# Patient Record
Sex: Female | Born: 1970 | Race: White | Hispanic: Yes | Marital: Married | Smoking: Never smoker
Health system: Southern US, Community
[De-identification: ages and names within clinical notes are randomized; demographics above are authoritative.]

## PROBLEM LIST (undated history)

## (undated) DIAGNOSIS — G43909 Migraine, unspecified, not intractable, without status migrainosus: Secondary | ICD-10-CM

## (undated) DIAGNOSIS — F419 Anxiety disorder, unspecified: Secondary | ICD-10-CM

## (undated) DIAGNOSIS — F32A Depression, unspecified: Secondary | ICD-10-CM

## (undated) HISTORY — DX: Anxiety disorder, unspecified: F41.9

## (undated) HISTORY — DX: Depression, unspecified: F32.A

## (undated) HISTORY — DX: Migraine, unspecified, not intractable, without status migrainosus: G43.909

---

## 2004-12-31 ENCOUNTER — Ambulatory Visit: Payer: Self-pay | Admitting: *Deleted

## 2004-12-31 ENCOUNTER — Inpatient Hospital Stay (HOSPITAL_COMMUNITY): Admission: AD | Admit: 2004-12-31 | Discharge: 2005-01-27 | Payer: Self-pay | Admitting: *Deleted

## 2005-01-05 ENCOUNTER — Ambulatory Visit: Payer: Self-pay | Admitting: Neonatology

## 2005-01-30 ENCOUNTER — Inpatient Hospital Stay (HOSPITAL_COMMUNITY): Admission: AD | Admit: 2005-01-30 | Discharge: 2005-01-30 | Payer: Self-pay | Admitting: Family Medicine

## 2005-03-10 IMAGING — US US UA DOPPLER RE-EVAL
1 series · 14 of 14 positions shown · non-contrast
Comparison: none

CLINICAL DATA: Abnormal Dopplers. 
FETAL DOPPLER ULTRASOUND:
Multiple images of the gravid uterus were obtained and multiple strips from the umbilical artery and middle cerebral artery were obtained.  In certain locations interrogation of the umbilical artery revealed absent end diastolic flow with no evidence for reversal of flow.  Other portions of the cord demonstrate the presence of diastolic flow and in these sections of the cord the umbilical artery S/D ratio was elevated with a mean 5.79 (NL < 4.76) for a 27 week gestation.  The middle cerebral artery pulsatility index is low at 1.19 (NL > 1.54).  No absence or reversal of end diastolic flow was seen associated with the middle cerebral artery Doppler assessment.

[Series 1: us ua doppler re-eval · 14 of 14 slices shown]
[im 1/14]
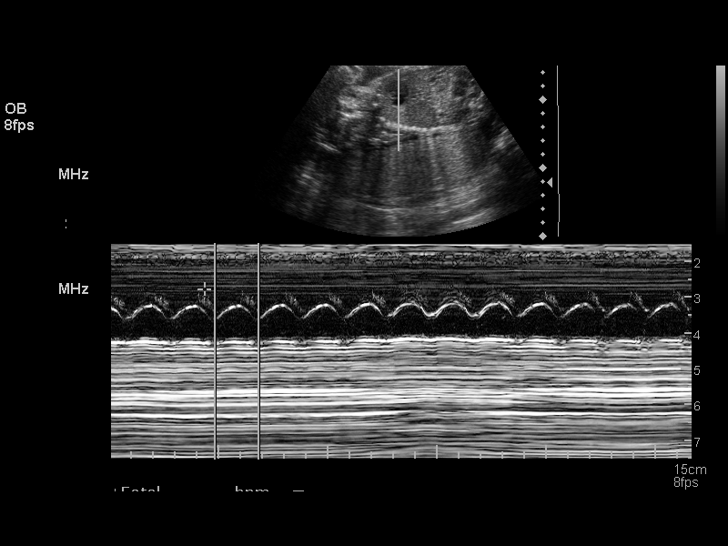
[im 2/14]
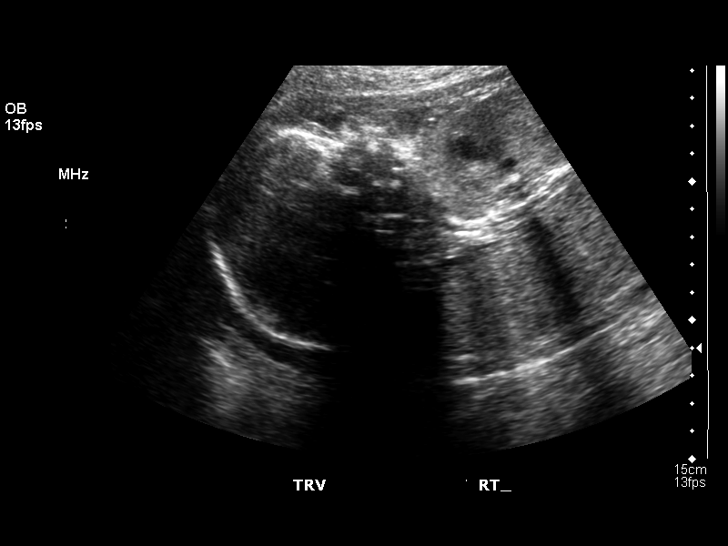
[im 3/14]
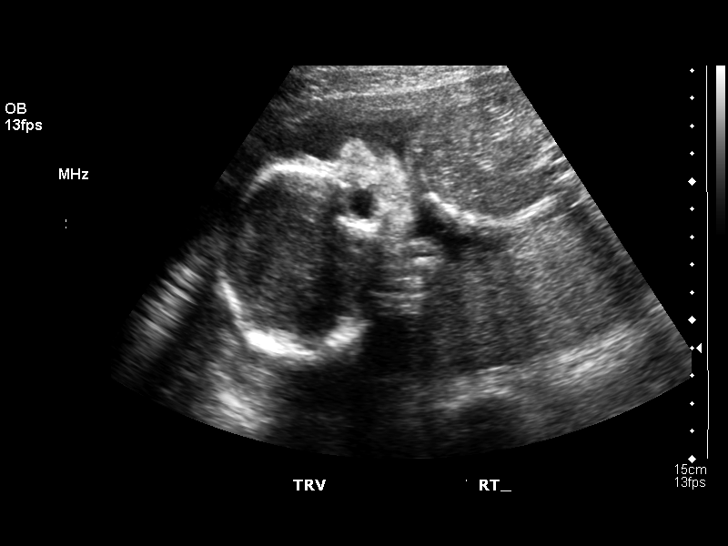
[im 4/14]
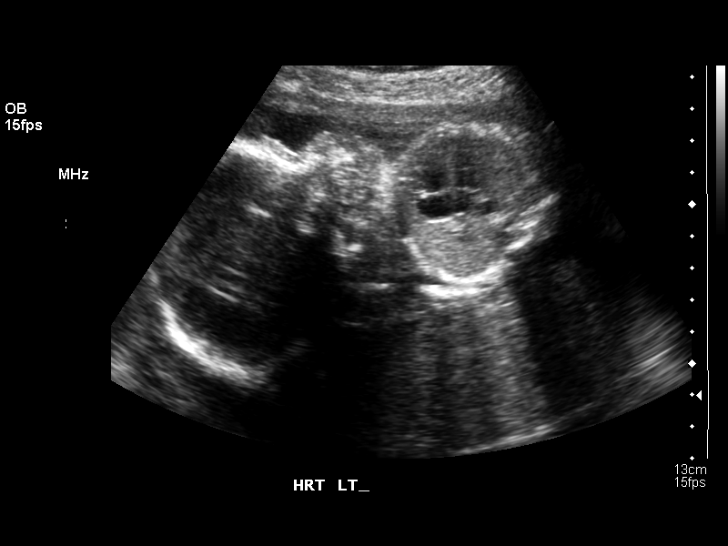
[im 5/14]
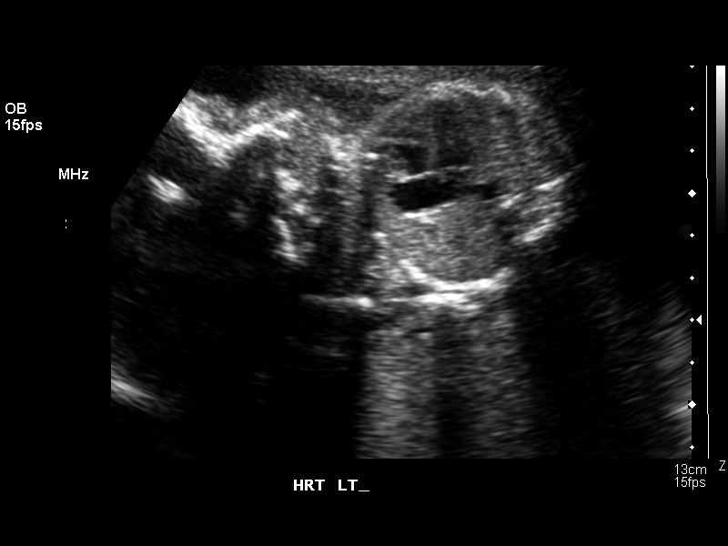
[im 6/14]
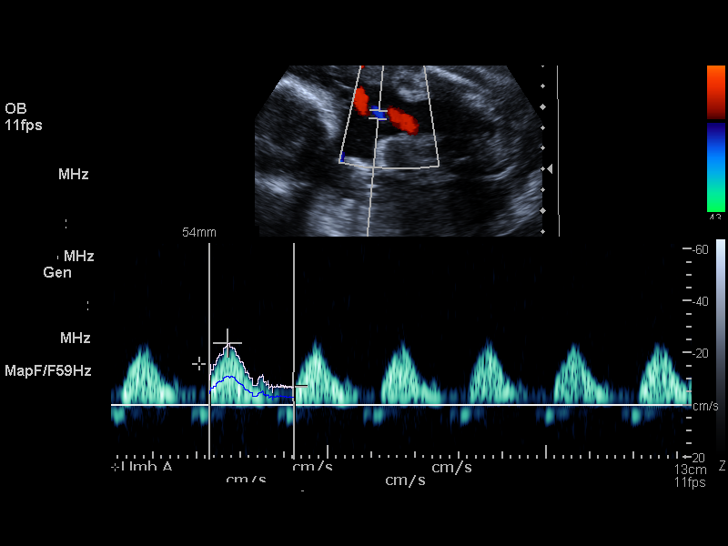
[im 7/14]
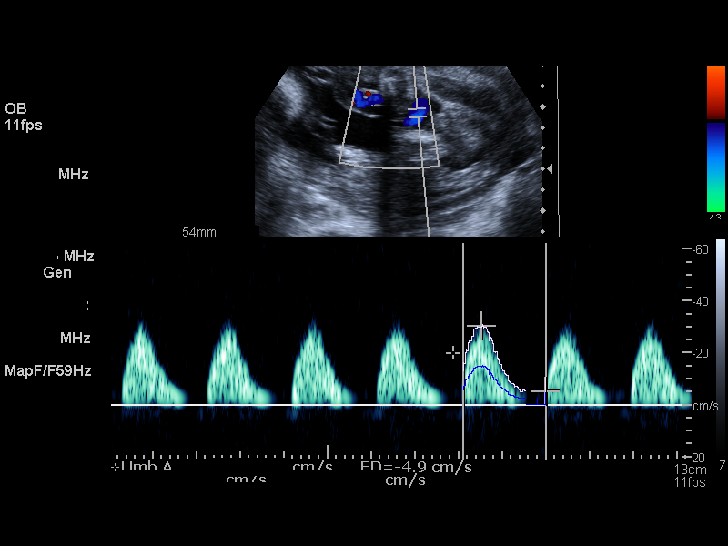
[im 8/14]
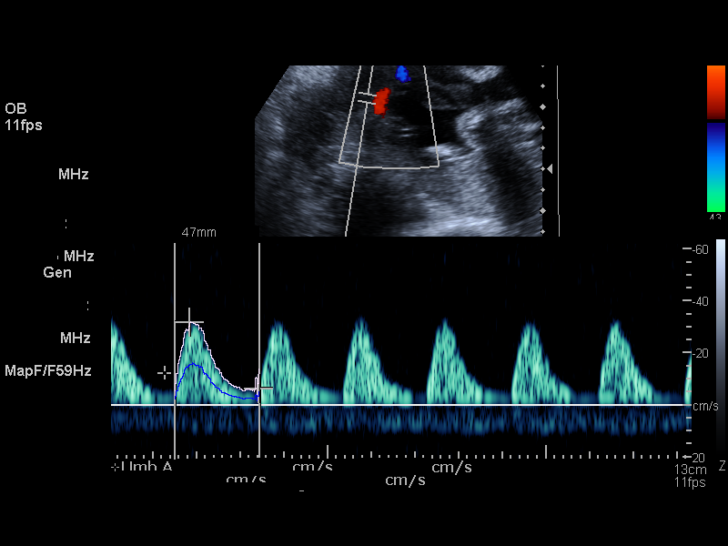
[im 9/14]
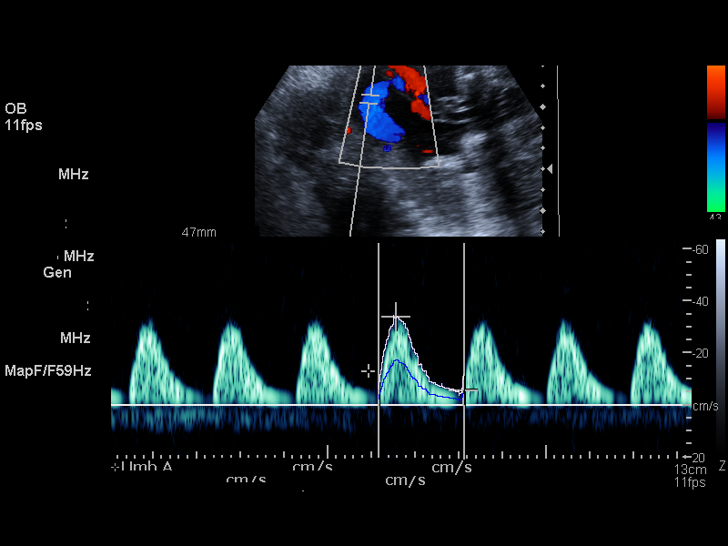
[im 10/14]
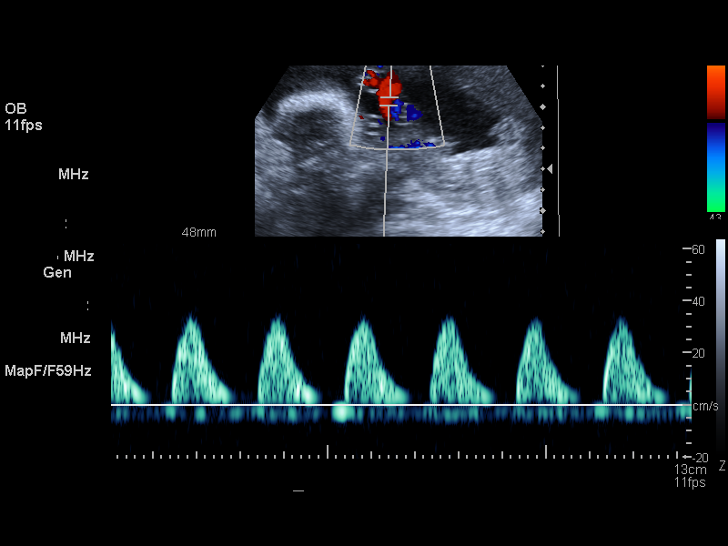
[im 11/14]
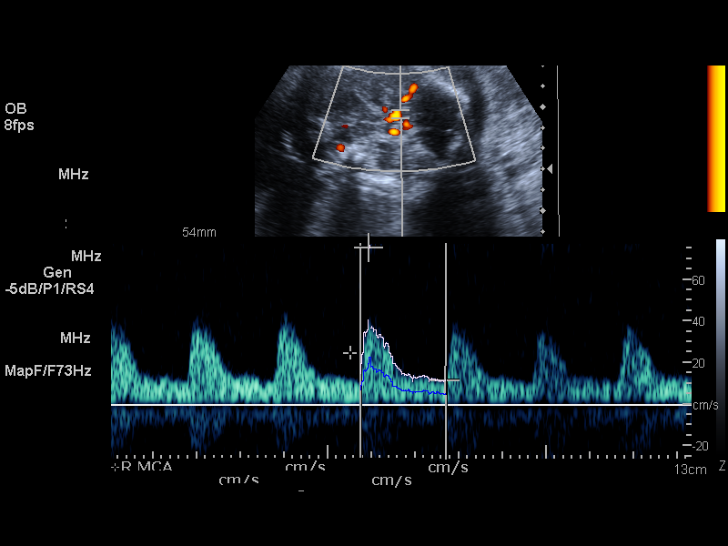
[im 12/14]
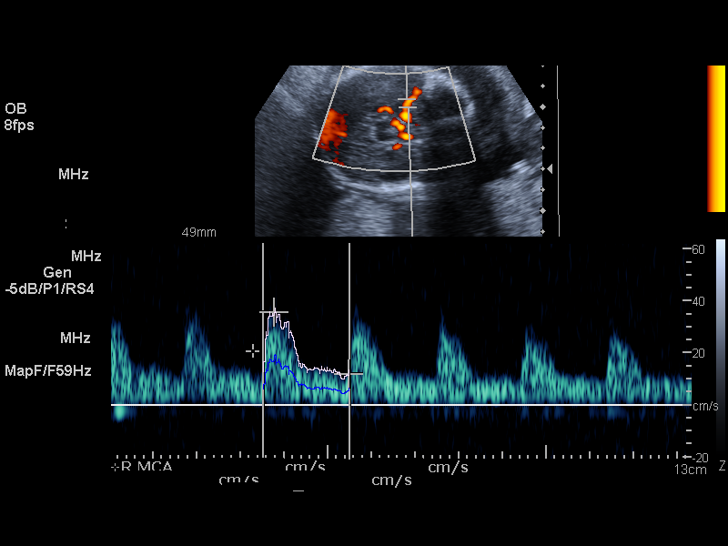
[im 13/14]
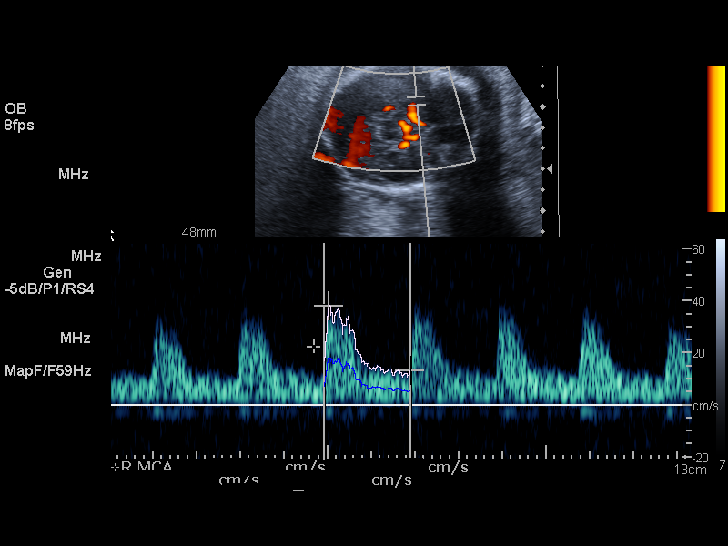
[im 14/14]
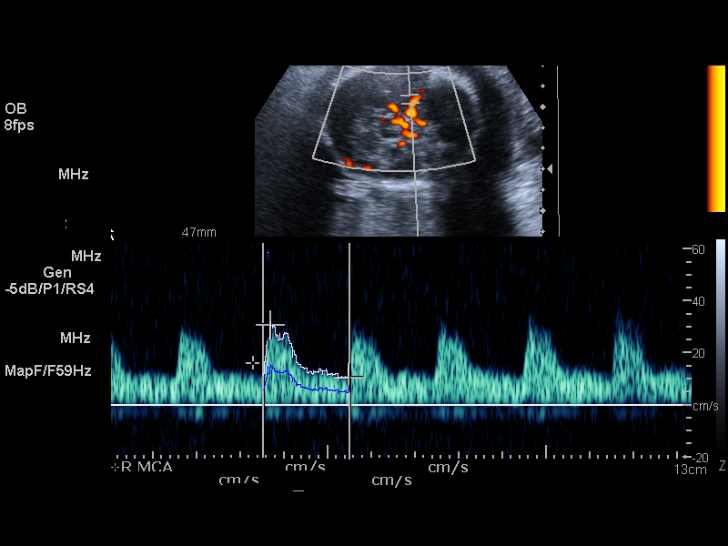

[14 of 14 positions shown; findings below may reference images not displayed]

IMPRESSION: As above.

## 2005-03-15 IMAGING — US US UA DOPPLER RE-EVAL
1 series · 14 of 18 positions shown · non-contrast
Comparison: none

CLINICAL DATA: IUGR, abnormal dopplers.

[Series 1: us ua doppler re-eval · 0.29mm/px · 14 of 18 slices shown]
[im 1/18]
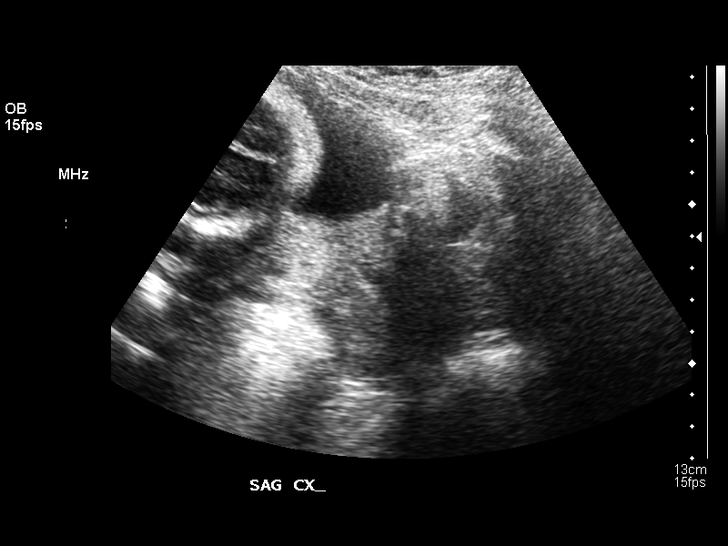
[im 2/18]
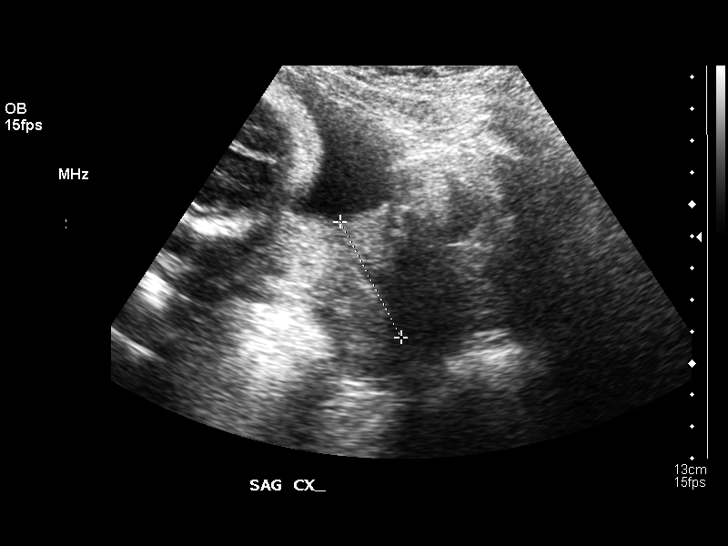
[im 4/18]
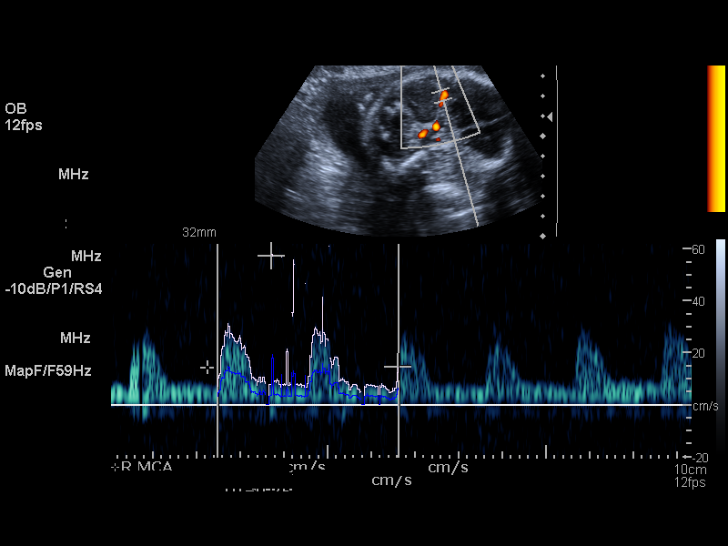
[im 5/18]
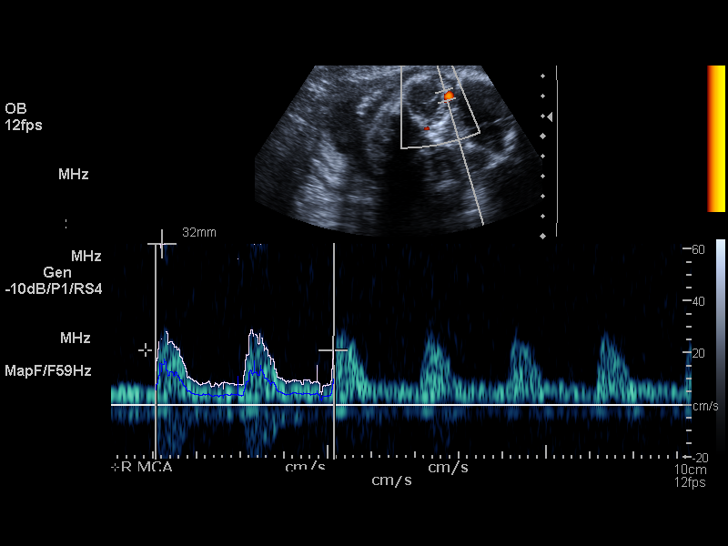
[im 6/18]
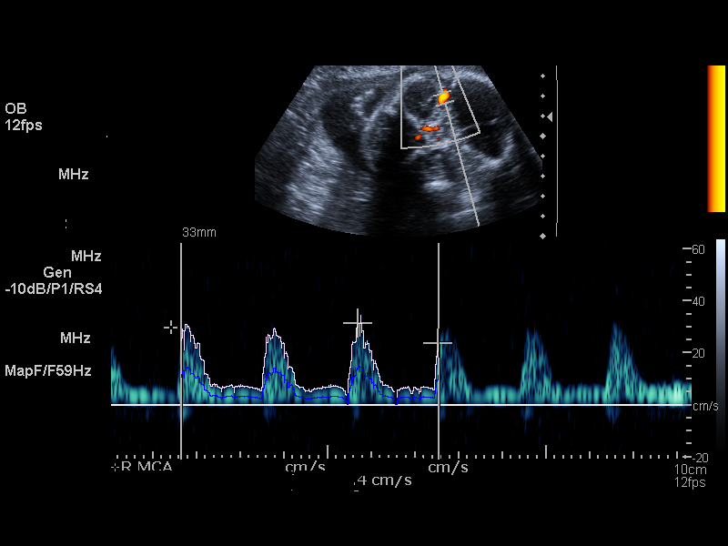
[im 8/18]
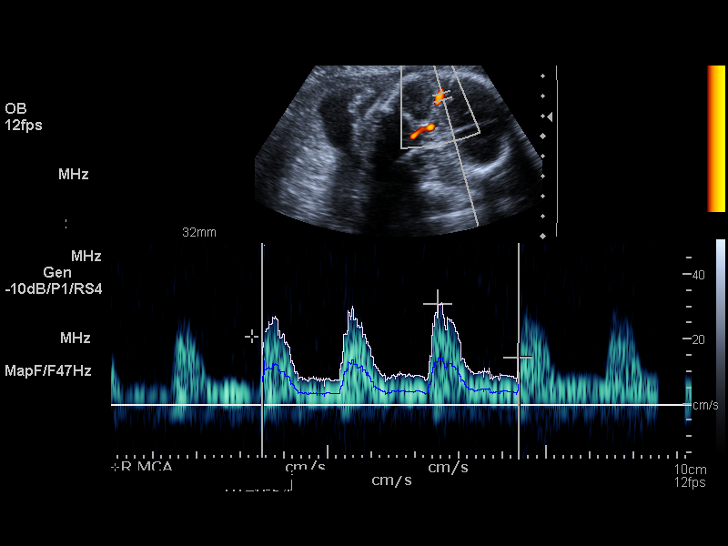
[im 9/18]
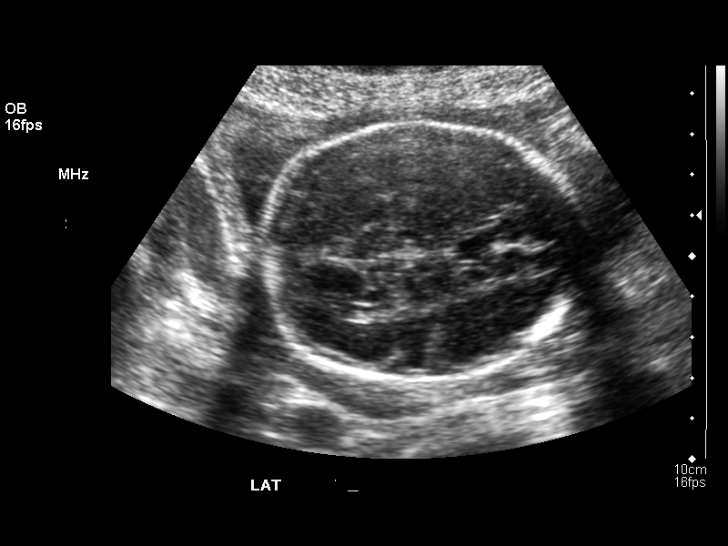
[im 10/18]
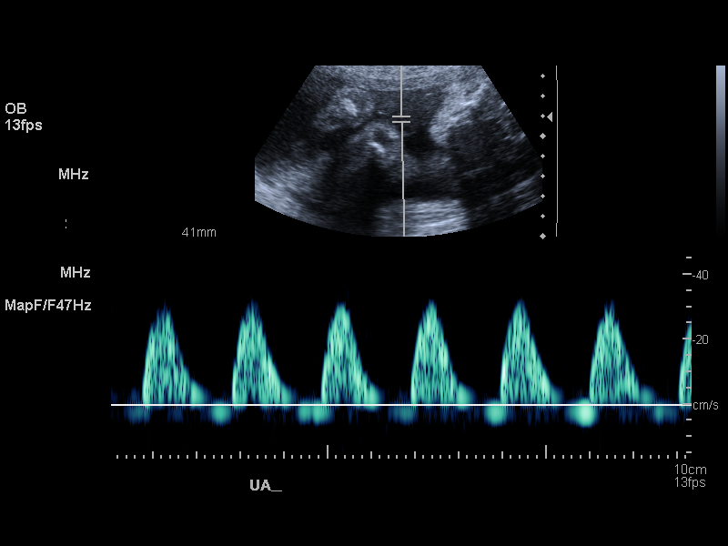
[im 11/18]
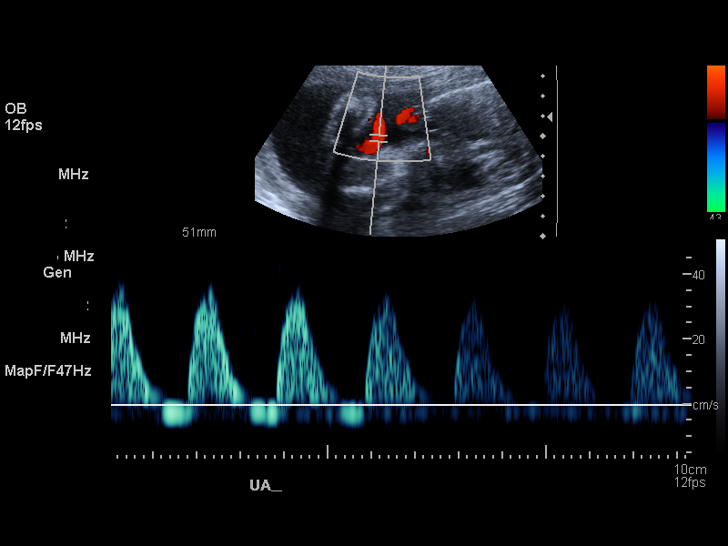
[im 13/18]
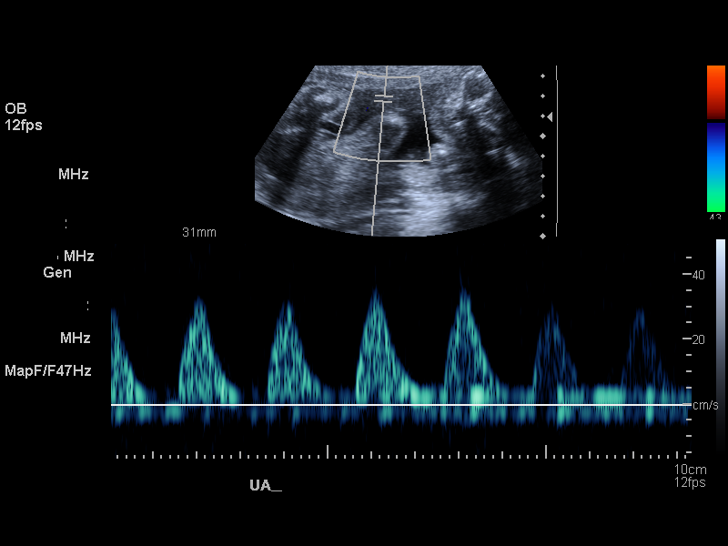
[im 14/18]
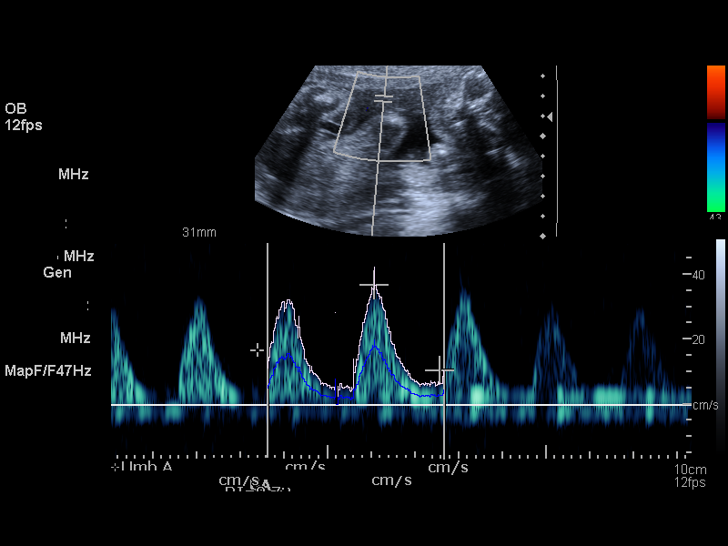
[im 15/18]
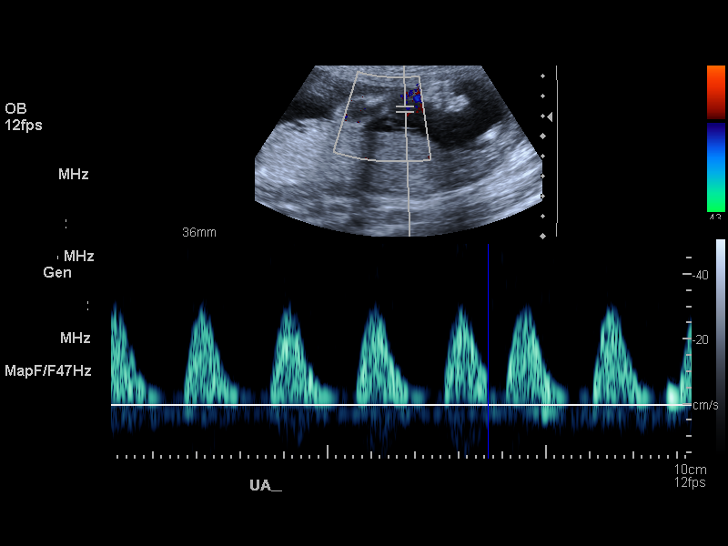
[im 17/18]
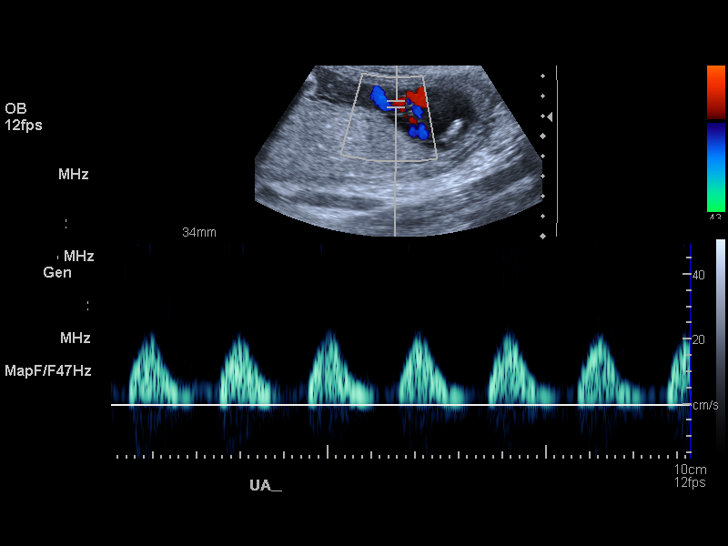
[im 18/18]
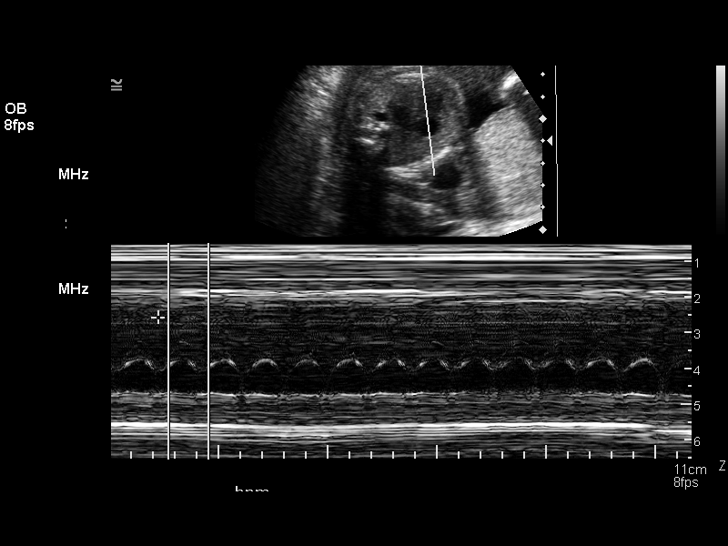

[14 of 18 positions shown; findings below may reference images not displayed]

DOPPLER ULTRASOUND OF FETUS:

 Number of Fetuses:  1
 Heart Rate: Not recorded 
 Movement:  yes
 Breathing:  no
 Presentation:  cephalic
 Amniotic Fluid (Subjective):  normal

 Fetal measurements and complete anatomic evaluation were not requested.  The following fetal anatomy was visualized during this exam:  lateral ventricles.

 DOPPLER MEASUREMENTS
 Umbilical Artery S/D Ratio:  No end diastolic flow

 Middle Cerebral Artery PI:  1.74  (NL> 1.54)

 MATERNAL FINDINGS
 Cervix:  4.1 cm transabdominally
IMPRESSION: Abnormal umbilical artery Doppler with no diastolic flow.

## 2005-03-16 IMAGING — US US UA DOPPLER RE-EVAL
1 series · 14 of 28 positions shown · non-contrast
Comparison: none

CLINICAL DATA: IUGR.  Abnormal fetal Dopplers.

[Series 1: us ua doppler re-eval · 0.29mm/px · 14 of 30 slices shown]
[im 2/30]
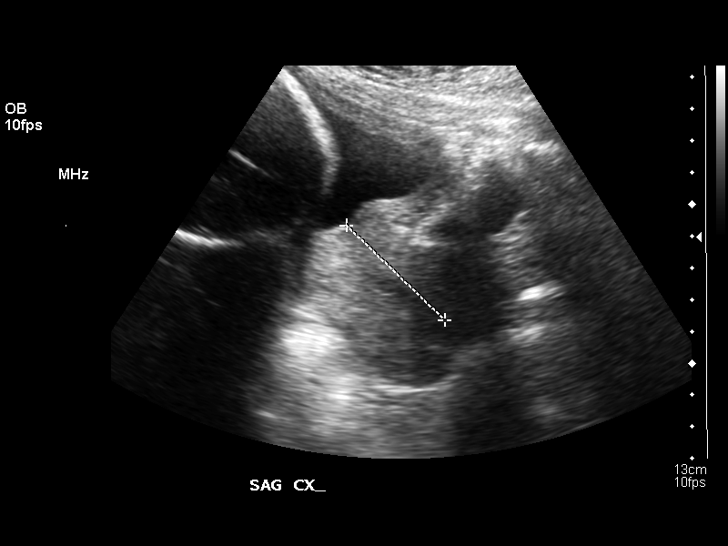
[im 4/30]
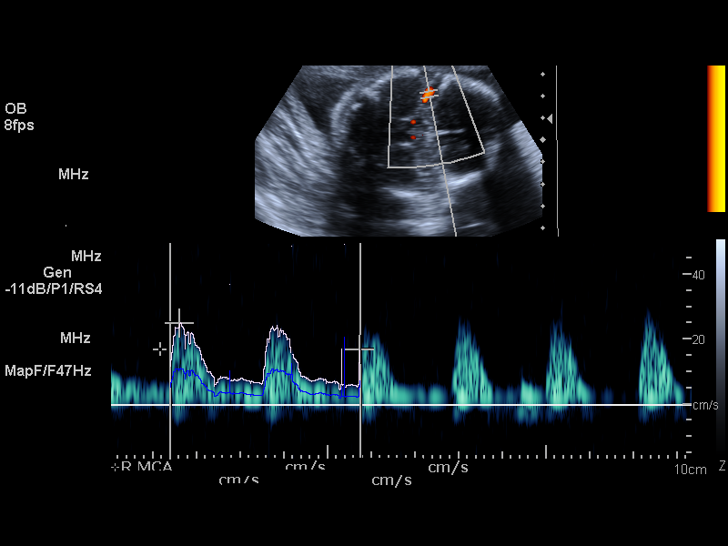
[im 6/30]
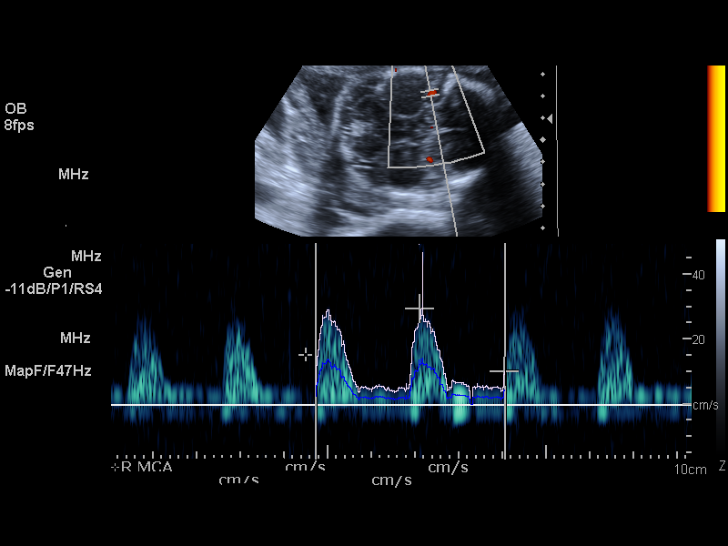
[im 8/30]
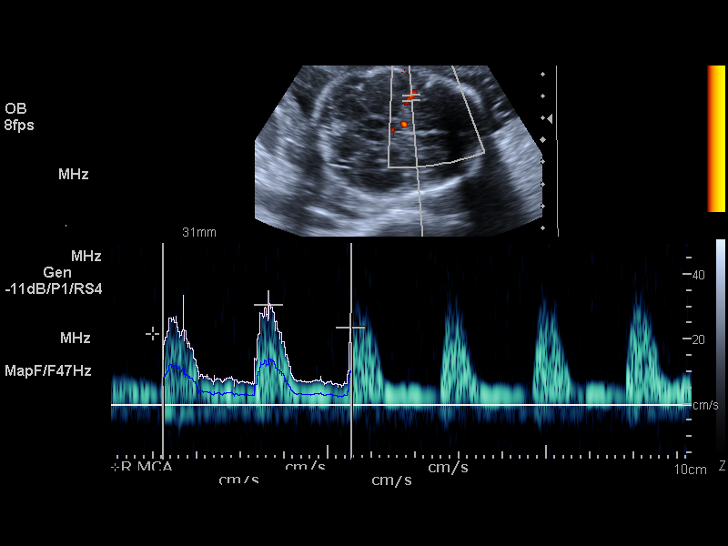
[im 10/30]
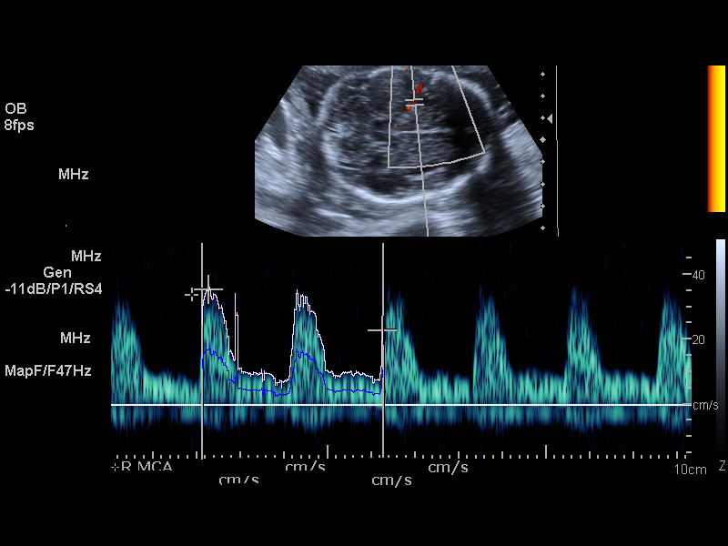
[im 12/30]
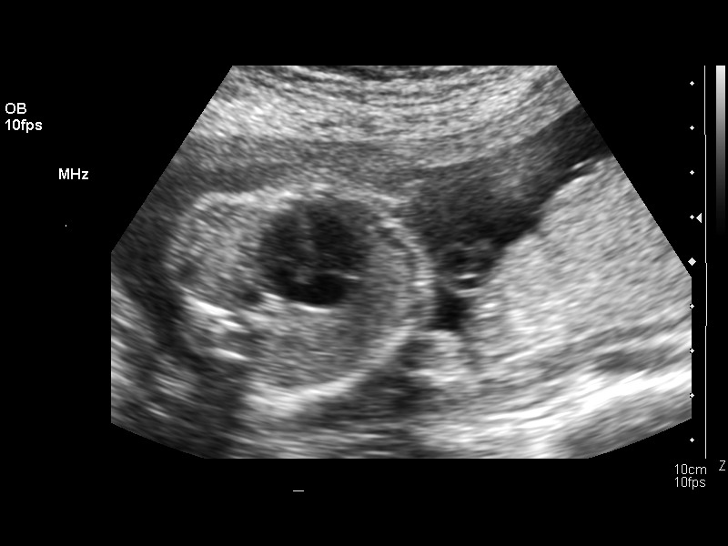
[im 14/30]
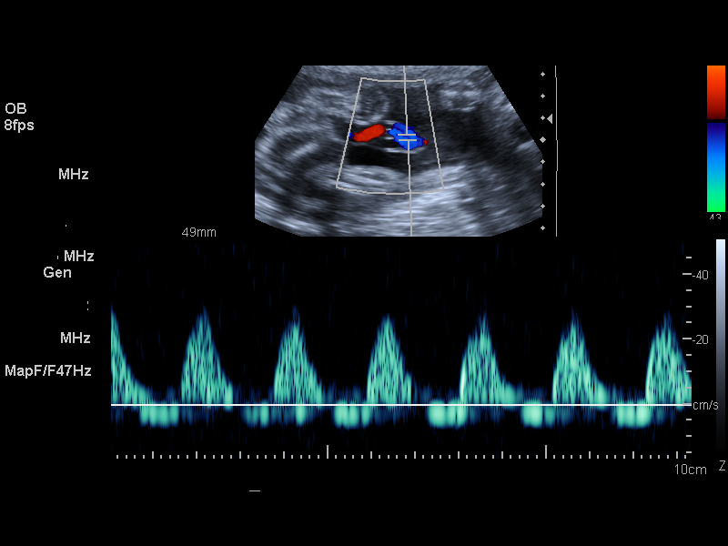
[im 17/30]
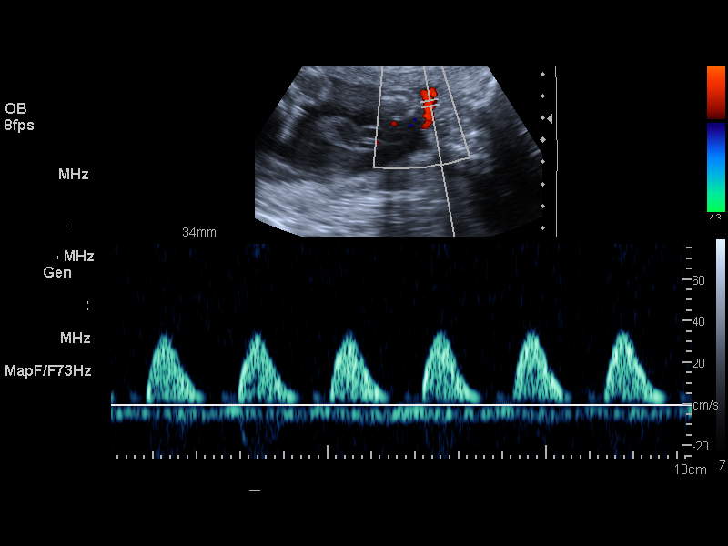
[im 19/30]
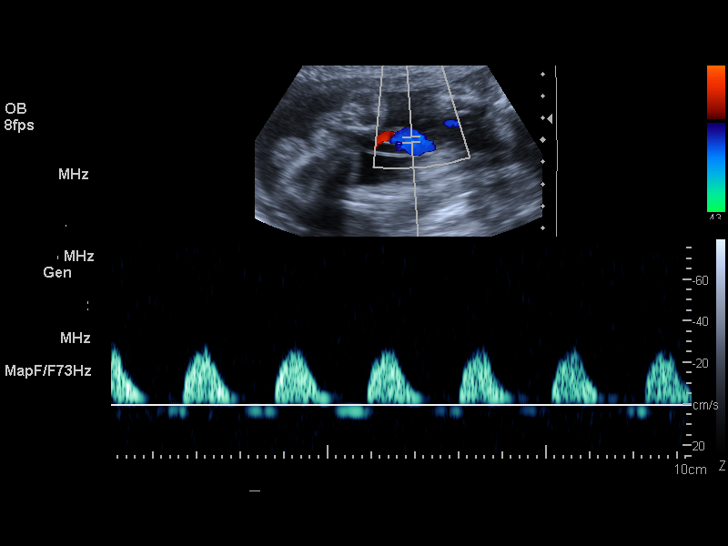
[im 21/30]
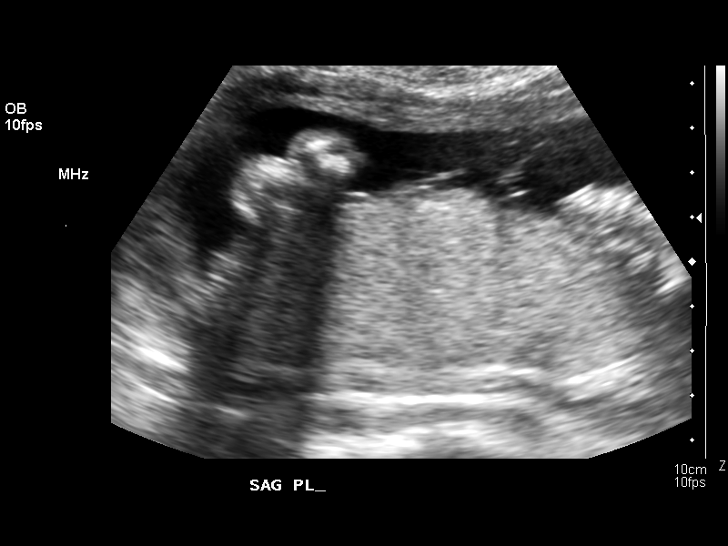
[im 23/30]
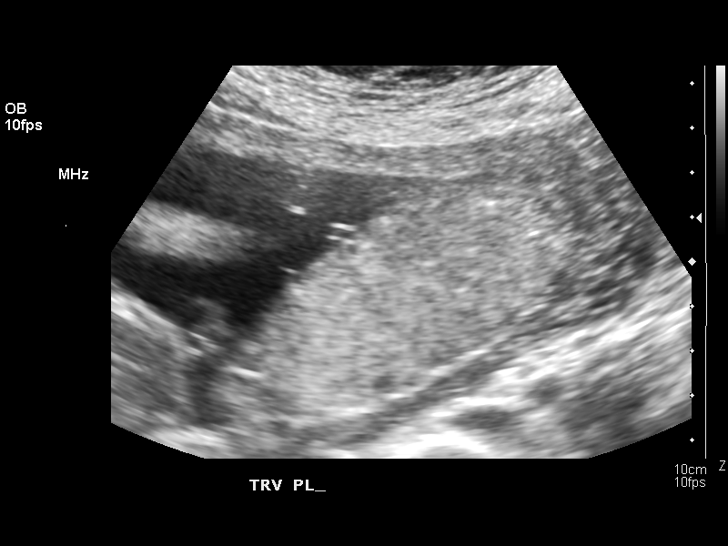
[im 25/30]
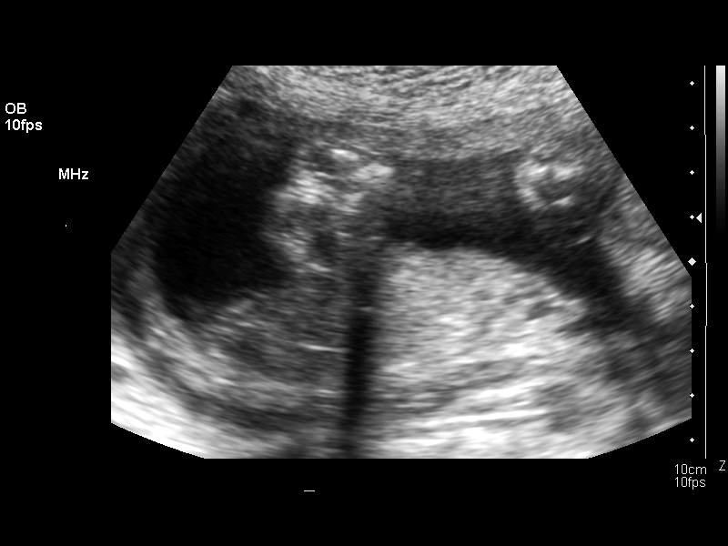
[im 27/30]
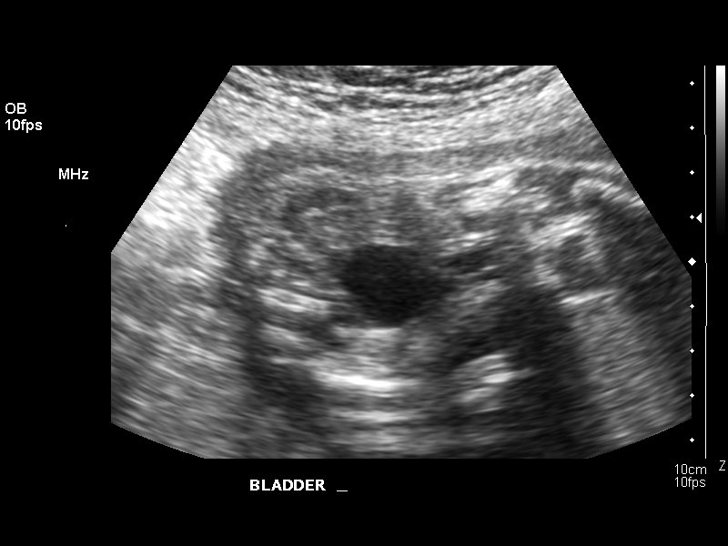
[im 30/30]
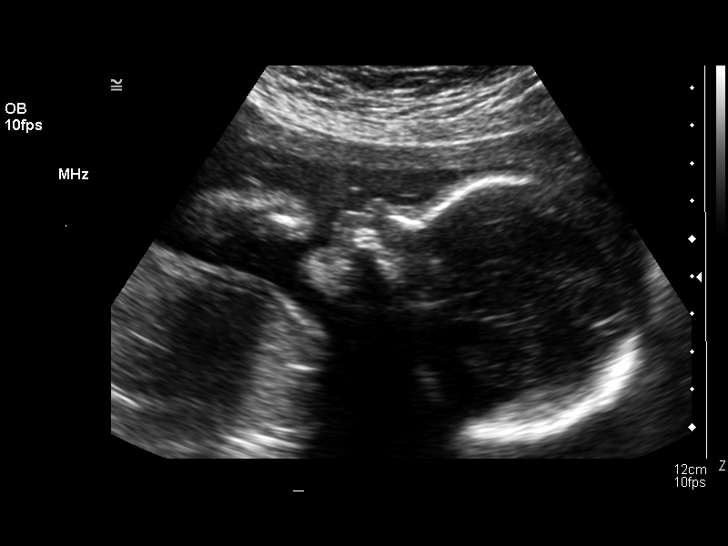

[14 of 28 positions shown; findings below may reference images not displayed]

LIMITED OBSTETRICAL ULTRASOUND:
 Number of Fetuses: 1
 Heart Rate:  139
 Movement:  Yes
 Breathing:  Yes
 Presentation:  Cephalic
 Placental Location:  Posterior
 Grade:  II
 Previa:  No
 Amniotic Fluid (Subjective):  Normal
 Amniotic Fluid (Objective):  3.4 cm Vertical pocket 

 Fetal measurements and complete anatomic evaluation were not requested.  The following fetal anatomy was visualized during this exam:  Lateral ventricles, four chamber heart, stomach, kidneys and bladder.

 MATERNAL UTERINE AND ADNEXAL FINDINGS
 Cervix:  4.3 cm Transabdominally

 DOPPLER ULTRASOUND OF FETUS:

 Umbilical Artery S/D Ratio:  No end-diastolic flow is seen.  There may be slight reversal of flow.  

 Middle Cerebral Artery PI:  1.14    (NL> 1.54)
IMPRESSION: 1.  Doppler of the umbilical artery shows no diastolic flow and possible reversal.
 2.  Abnormal MCA pulsatility index.
 3.  Quantity of amniotic fluid at 5th percentile for 27 week gestation.

## 2005-03-18 IMAGING — US US UA DOPPLER RE-EVAL
1 series · 13 of 13 positions shown · non-contrast
Comparison: none

CLINICAL DATA: 28 weeks gestational age.  IUGR.  Follow-up fetal Dopplers.

[Series 1: us ua doppler re-eval · 0.29mm/px · 13 of 13 slices shown]
[im 1/13]
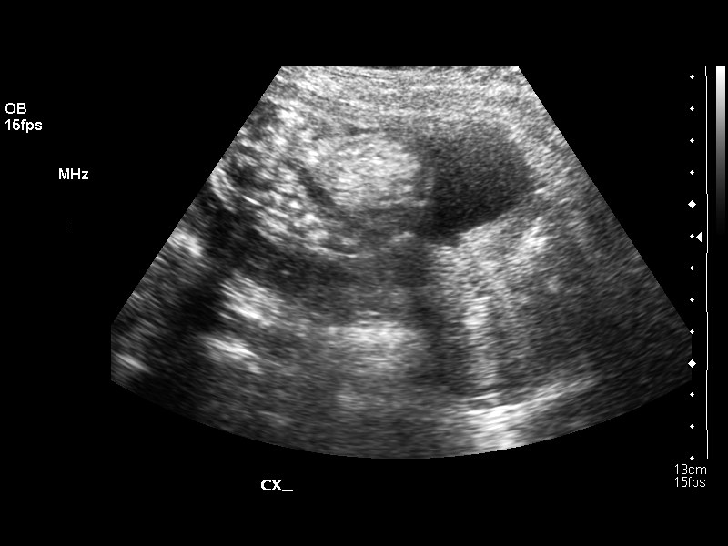
[im 2/13]
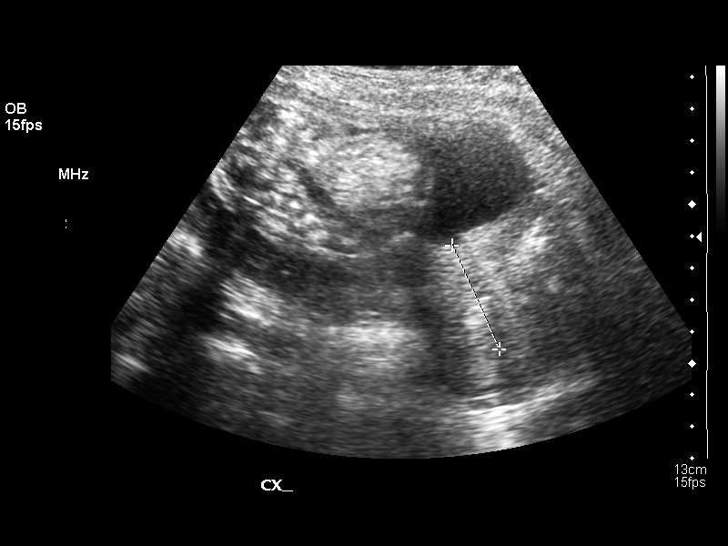
[im 3/13]
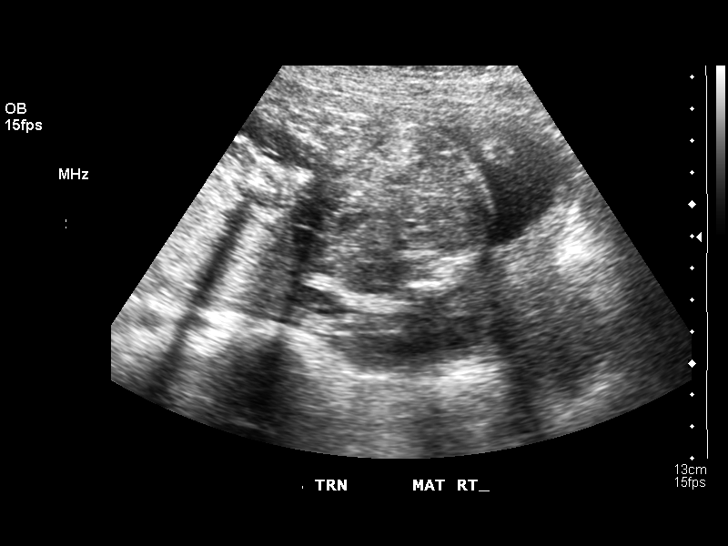
[im 4/13]
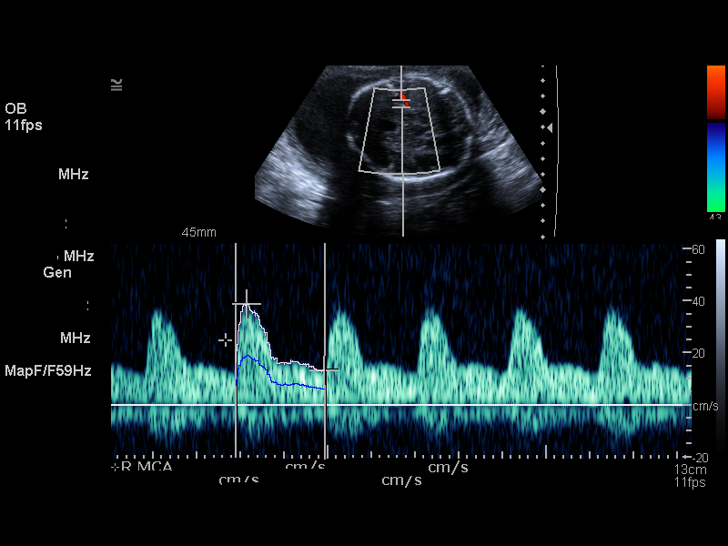
[im 5/13]
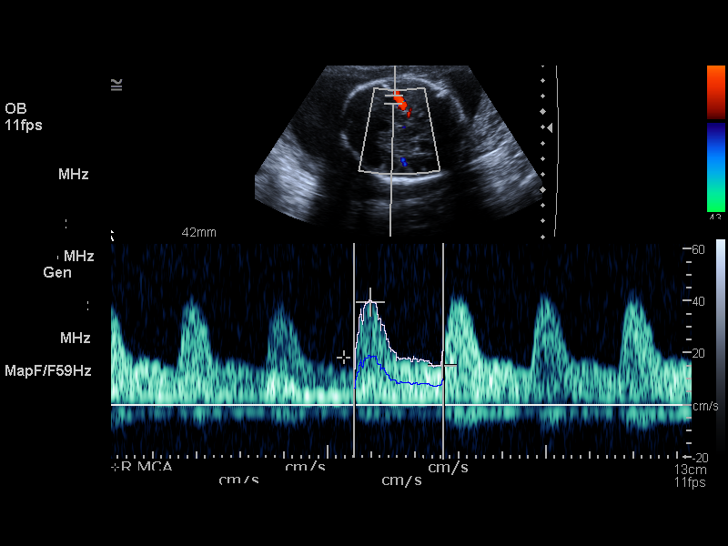
[im 6/13]
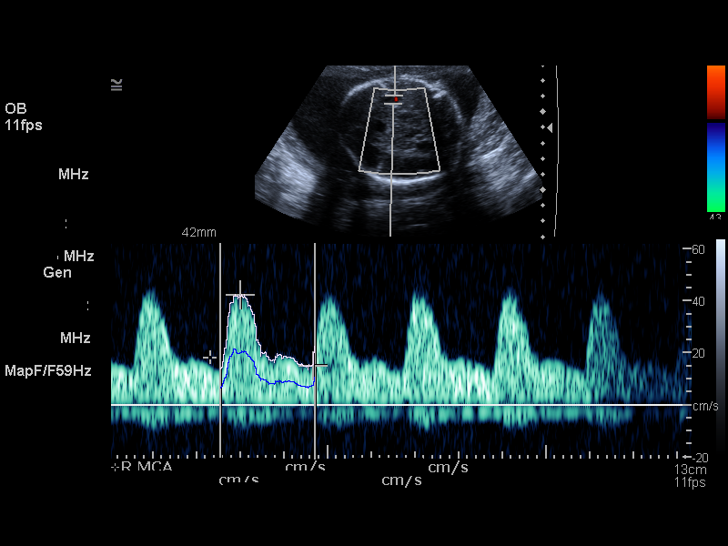
[im 7/13]
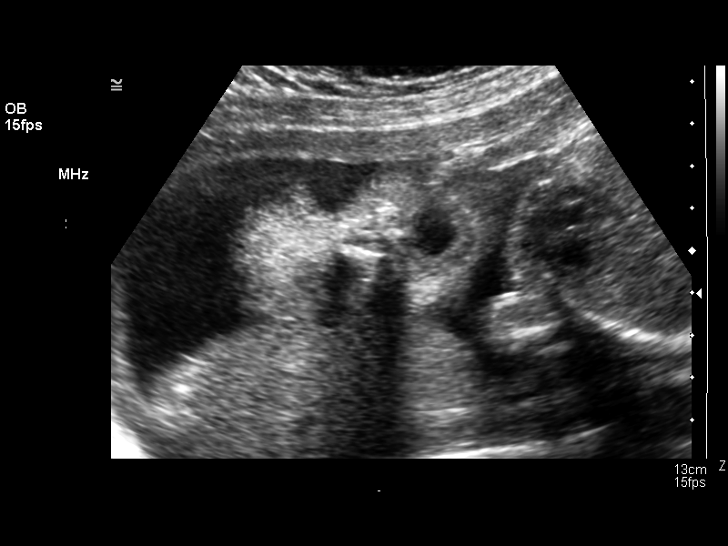
[im 8/13]
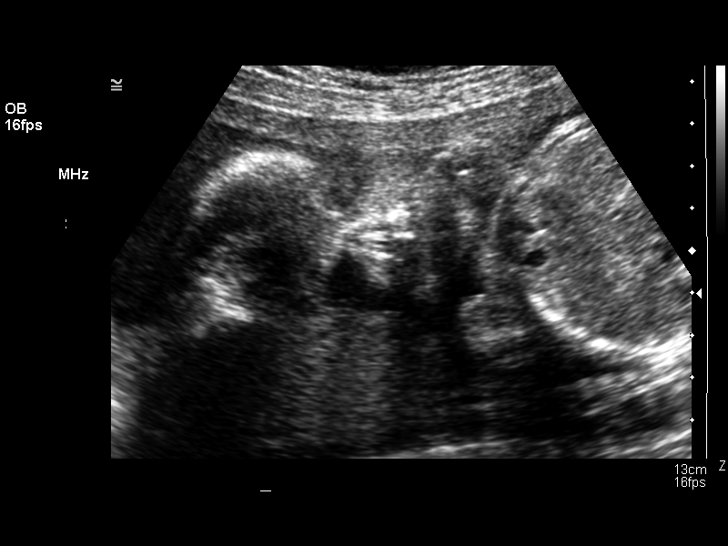
[im 9/13]
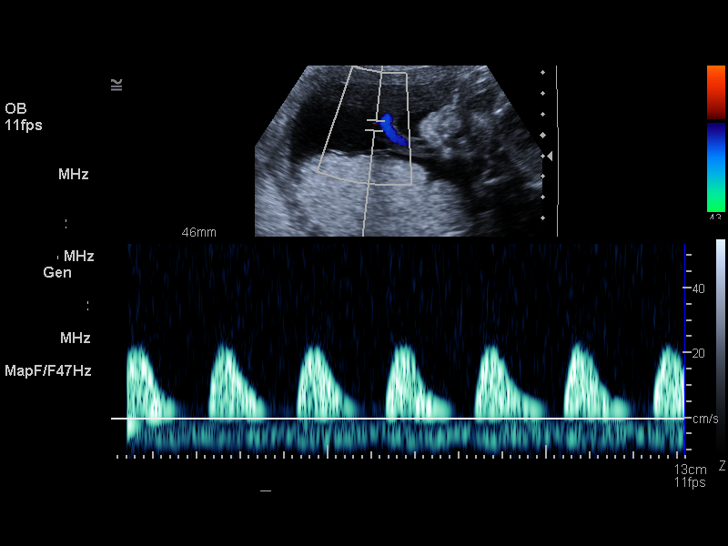
[im 10/13]
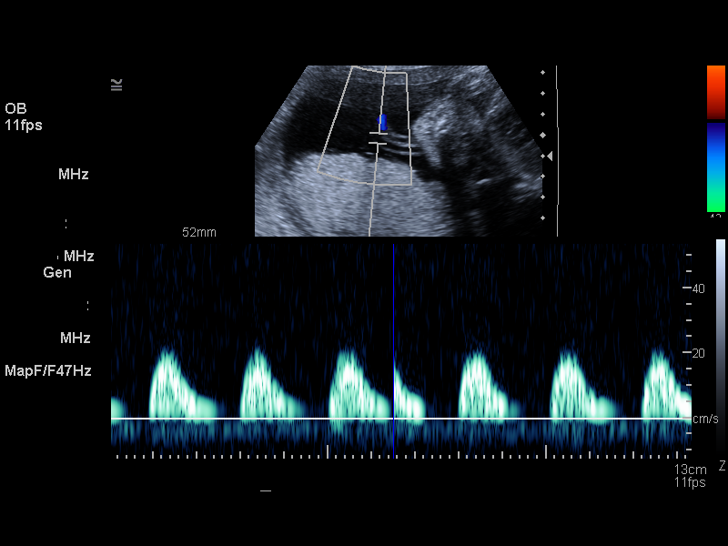
[im 11/13]
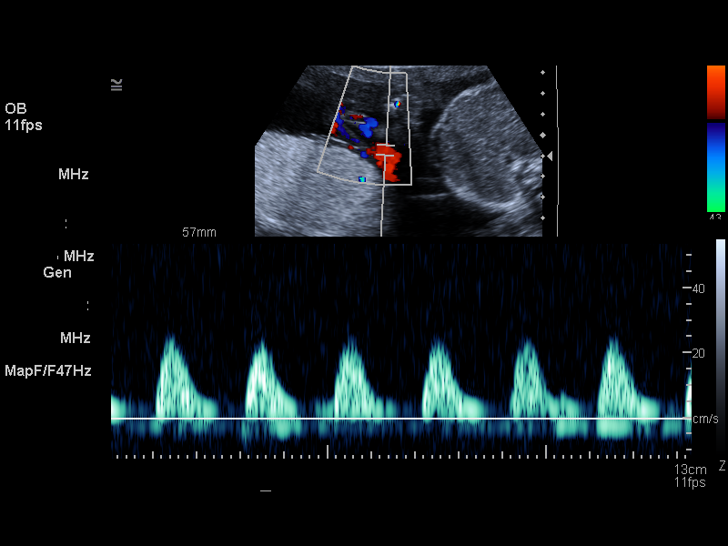
[im 12/13]
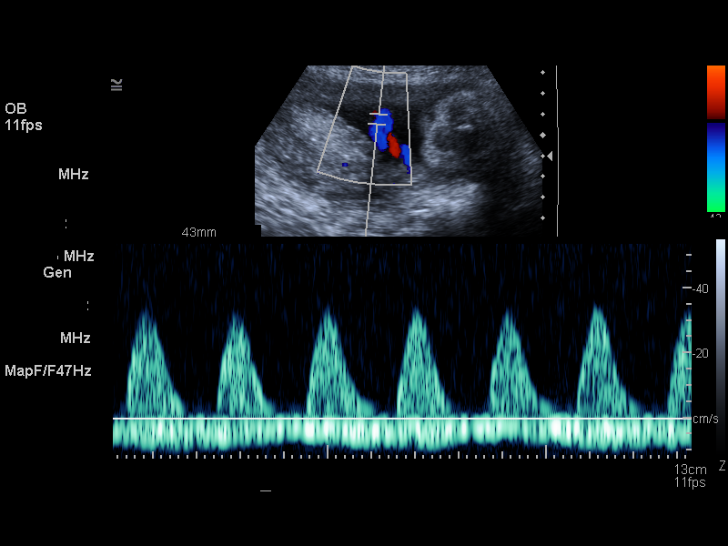
[im 13/13]
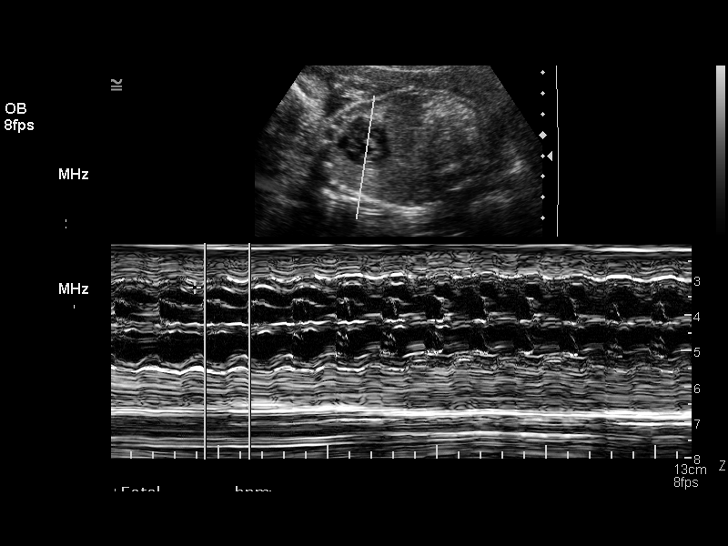

[13 of 13 positions shown; findings below may reference images not displayed]

DOPPLER ULTRASOUND OF FETUS:

 Number of Fetuses:  1
 Heart Rate:  147
 Movement:  Yes

 DOPPLER MEASUREMENTS
 Umbilical Artery S/D Ratio: Spectral waveform shows absence of diastolic flow, however there is no evidence of diastolic flow reversal

 Middle Cerebral Artery PI: 1.11    (NL> 1.54)

 MATERNAL FINDINGS
 Cervix: 3.6 cm Transabdominally
IMPRESSION: 1.  Persistent abnormal umbilical artery Doppler with absent diastolic flow.  There is no evidence of diastolic flow reversal.
 2.  Persistent decreased middle cerebral artery pulsatility index.

## 2019-09-16 ENCOUNTER — Other Ambulatory Visit: Payer: Self-pay

## 2019-09-16 DIAGNOSIS — Z20822 Contact with and (suspected) exposure to covid-19: Secondary | ICD-10-CM

## 2019-09-17 LAB — NOVEL CORONAVIRUS, NAA: SARS-CoV-2, NAA: NOT DETECTED

## 2019-09-19 ENCOUNTER — Telehealth: Payer: Self-pay | Admitting: General Practice

## 2019-09-19 NOTE — Telephone Encounter (Signed)
Gave patient negative covid test results Patient understood 

## 2022-04-28 ENCOUNTER — Other Ambulatory Visit (HOSPITAL_COMMUNITY)
Admission: RE | Admit: 2022-04-28 | Discharge: 2022-04-28 | Disposition: A | Discharge status: Home / Self Care | Payer: Self-pay | Source: Ambulatory Visit | Attending: Obstetrics & Gynecology | Admitting: Obstetrics & Gynecology

## 2022-04-28 ENCOUNTER — Encounter: Payer: Self-pay | Admitting: Obstetrics & Gynecology

## 2022-04-28 ENCOUNTER — Ambulatory Visit (INDEPENDENT_AMBULATORY_CARE_PROVIDER_SITE_OTHER): Payer: Self-pay | Admitting: Obstetrics & Gynecology

## 2022-04-28 VITALS — BP 110/74 | HR 70 | Resp 16 | Ht 61.75 in | Wt 134.0 lb

## 2022-04-28 DIAGNOSIS — Z01419 Encounter for gynecological examination (general) (routine) without abnormal findings: Secondary | ICD-10-CM | POA: Insufficient documentation

## 2022-04-28 DIAGNOSIS — Z780 Asymptomatic menopausal state: Secondary | ICD-10-CM

## 2022-04-28 NOTE — Progress Notes (Signed)
? ? ?Tamara Hanna [facility] [birth year] [id number] ? ? ?History:    51 y.o. G2P2L2  Married ? ?RP:  New patient presenting for annual gyn exam  ? ?HPI:  Postmenopausal, well on no HRT.  No PMB.  No pelvic pain.  Dryness with IC.  Recommend coconut oil.  Last Pap 13 yrs ago, negative.  Pap reflex today.  Breasts normal.  Will schedule first Mammo.  BMI 24.71.  Fasting health labs here today.  Schedule Colono.  Has Fibromyalgia. ? ? ?Past medical history,surgical history, family history and social history were all reviewed and documented in the EPIC chart. ? ?Gynecologic History ?No LMP recorded. Patient is postmenopausal. ? ?Obstetric History ?OB History  ?Gravida Para Term Preterm AB Living  ?$Remove'2 2 1 1   2  '[id number]$ ?SAB IAB Ectopic Multiple Live Births  ?        2  ?  ?# Outcome Date GA Lbr Len/2nd Weight Sex Delivery Anes PTL Lv  ?2 Preterm           ?1 Term           ? ? ? ?ROS: A ROS was performed and pertinent positives and negatives are included in the history. ? GENERAL: No fevers or chills. HEENT: No change in vision, no earache, sore throat or sinus congestion. NECK: No pain or stiffness. CARDIOVASCULAR: No chest pain or pressure. No palpitations. PULMONARY: No shortness of breath, cough or wheeze. GASTROINTESTINAL: No abdominal pain, nausea, vomiting or diarrhea, melena or bright red blood per rectum. GENITOURINARY: No urinary frequency, urgency, hesitancy or dysuria. MUSCULOSKELETAL: No joint or muscle pain, no back pain, no recent trauma. DERMATOLOGIC: No rash, no itching, no lesions. ENDOCRINE: No polyuria, polydipsia, no heat or cold intolerance. No recent change in weight. HEMATOLOGICAL: No anemia or easy bruising or bleeding. NEUROLOGIC: No headache, seizures, numbness, tingling or weakness. PSYCHIATRIC: No depression, no loss of interest in normal activity or change in sleep pattern.  ?  ? ?Exam: ? ? ?BP 110/74   Pulse 70   Resp 16   Ht 5' 1.75" (1.568 m)   Wt 134 lb (60.8 kg)   BMI 24.71 kg/m?  ? ?Body  mass index is 24.71 kg/m?. ? ?General appearance : Well developed well nourished female. No acute distress ?HEENT: Eyes: no retinal hemorrhage or exudates,  Neck supple, trachea midline, no carotid bruits, no thyroidmegaly ?Lungs: Clear to auscultation, no rhonchi or wheezes, or rib retractions  ?Heart: Regular rate and rhythm, no murmurs or gallops ?Breast:Examined in sitting and supine position were symmetrical in appearance, no palpable masses or tenderness,  no skin retraction, no nipple inversion, no nipple discharge, no skin discoloration, no axillary or supraclavicular lymphadenopathy ?Abdomen: no palpable masses or tenderness, no rebound or guarding ?Extremities: no edema or skin discoloration or tenderness ? ?Pelvic: Vulva: Normal ?            Vagina: No gross lesions or discharge ? Cervix: No gross lesions or discharge.  Pap reflex done. ? Uterus  AV, normal size, shape and consistency, non-tender and mobile ? Adnexa  Without masses or tenderness ? Anus: Normal ? ? ?Assessment/Plan:  51 y.o. female for annual exam  ? ?1. Encounter for routine gynecological examination with Papanicolaou smear of cervix ?Postmenopausal, well on no HRT.  No PMB.  No pelvic pain.  Dryness with IC.  Recommend coconut oil.  Last Pap 13 yrs ago, negative.  Pap reflex today.  Breasts normal.  Will schedule first Mammo.  BMI 24.71.  Fasting health labs here today.  Schedule Colono.  Has Fibromyalgia. ?- Cytology - PAP( [place]) ?- CBC ?- Comp Met (CMET) ?- TSH ?- Lipid Profile ?- Vitamin D (25 hydroxy) ? ?2. Postmenopause ?Postmenopausal, well on no HRT.  No PMB.  No pelvic pain.  Dryness with IC.  Recommend coconut oil. ? ?Other orders ?- Apremilast (OTEZLA) 30 MG TABS; Take by mouth. ?- Cholecalciferol (VITAMIN D3 PO); Take by mouth. ?- B Complex Vitamins (B COMPLEX PO); Take by mouth. ?- MAGNESIUM PO; Take by mouth. ?- Turmeric (QC TUMERIC COMPLEX PO); Take by mouth.  ? ?Princess Bruins MD, 11:06 AM 04/28/2022 ? ?  ?

## 2022-04-30 ENCOUNTER — Other Ambulatory Visit: Payer: Self-pay

## 2022-04-30 DIAGNOSIS — R7989 Other specified abnormal findings of blood chemistry: Secondary | ICD-10-CM

## 2022-05-04 LAB — CYTOLOGY - PAP
Comment: NEGATIVE
Diagnosis: UNDETERMINED — AB
High risk HPV: NEGATIVE

## 2022-05-06 LAB — LIPID PANEL
Cholesterol: 229 mg/dL — ABNORMAL HIGH (ref <200)
HDL: 64 mg/dL (ref >=50)
LDL Cholesterol (Calc): 141 mg/dL (calc) — ABNORMAL HIGH
Non-HDL Cholesterol (Calc): 165 mg/dL (calc) — ABNORMAL HIGH (ref <130)
Total CHOL/HDL Ratio: 3.6 (calc) (ref <5.0)
Triglycerides: 118 mg/dL (ref <150)

## 2022-05-06 LAB — CBC
HCT: 39.5 % (ref 35.0–45.0)
Hemoglobin: 13.3 g/dL (ref 11.7–15.5)
MCH: 30.1 pg (ref 27.0–33.0)
MCHC: 33.7 g/dL (ref 32.0–36.0)
MCV: 89.4 fL (ref 80.0–100.0)
MPV: 9.8 fL (ref 7.5–12.5)
Platelets: 331 10*3/uL (ref 140–400)
RBC: 4.42 10*6/uL (ref 3.80–5.10)
RDW: 12.2 % (ref 11.0–15.0)
WBC: 6.1 10*3/uL (ref 3.8–10.8)

## 2022-05-06 LAB — COMPREHENSIVE METABOLIC PANEL
AG Ratio: 1.5 (calc) (ref 1.0–2.5)
ALT: 43 U/L — ABNORMAL HIGH (ref 6–29)
AST: 27 U/L (ref 10–35)
Albumin: 4.8 g/dL (ref 3.6–5.1)
Alkaline phosphatase (APISO): 98 U/L (ref 31–125)
BUN: 11 mg/dL (ref 7–25)
CO2: 27 mmol/L (ref 20–32)
Calcium: 10.4 mg/dL — ABNORMAL HIGH (ref 8.6–10.2)
Chloride: 103 mmol/L (ref 98–110)
Creat: 0.82 mg/dL (ref 0.50–0.99)
Globulin: 3.1 g/dL (calc) (ref 1.9–3.7)
Glucose, Bld: 86 mg/dL (ref 65–99)
Potassium: 4.8 mmol/L (ref 3.5–5.3)
Sodium: 141 mmol/L (ref 135–146)
Total Bilirubin: 0.5 mg/dL (ref 0.2–1.2)
Total Protein: 7.9 g/dL (ref 6.1–8.1)

## 2022-05-06 LAB — TSH: TSH: 2.07 mIU/L

## 2022-05-06 LAB — VITAMIN D 25 HYDROXY (VIT D DEFICIENCY, FRACTURES): Vit D, 25-Hydroxy: 37 ng/mL (ref 30–100)

## 2022-06-05 ENCOUNTER — Telehealth: Payer: Self-pay | Admitting: *Deleted

## 2022-06-05 NOTE — Telephone Encounter (Signed)
Dr. Seymour Bars -please review PAP dated 04/28/22 and advise on follow-up.

## 2022-06-17 NOTE — Telephone Encounter (Signed)
Genia Del, MD  You 20 hours ago (1:15 PM)    ASCUS/HPV HR Negative.  Previous Pap Neg about 13 yrs ago.  Repeat Pap test in 1 year at next [facility]am.    Routing to [facility] Spanish pool to contact patient with results.

## 2022-06-17 NOTE — Telephone Encounter (Signed)
Berna Spare A, CMA  You 53 minutes ago (11:23 AM)    Patient was informed of pap smear results. Information given in Spanish.

## 2022-06-17 NOTE — Telephone Encounter (Signed)
1 year recall placed.   Encounter closed.

## 2022-07-30 ENCOUNTER — Other Ambulatory Visit: Payer: Self-pay

## 2022-07-30 DIAGNOSIS — R7989 Other specified abnormal findings of blood chemistry: Secondary | ICD-10-CM

## 2022-07-31 LAB — COMPREHENSIVE METABOLIC PANEL
AG Ratio: 1.7 (calc) (ref 1.0–2.5)
ALT: 19 U/L (ref 6–29)
AST: 19 U/L (ref 10–35)
Albumin: 4.5 g/dL (ref 3.6–5.1)
Alkaline phosphatase (APISO): 96 U/L (ref 31–125)
BUN: 8 mg/dL (ref 7–25)
CO2: 29 mmol/L (ref 20–32)
Calcium: 9.7 mg/dL (ref 8.6–10.2)
Chloride: 104 mmol/L (ref 98–110)
Creat: 0.86 mg/dL (ref 0.50–0.99)
Globulin: 2.7 g/dL (calc) (ref 1.9–3.7)
Glucose, Bld: 82 mg/dL (ref 65–99)
Potassium: 4.2 mmol/L (ref 3.5–5.3)
Sodium: 140 mmol/L (ref 135–146)
Total Bilirubin: 0.5 mg/dL (ref 0.2–1.2)
Total Protein: 7.2 g/dL (ref 6.1–8.1)

## 2022-09-25 ENCOUNTER — Emergency Department (HOSPITAL_COMMUNITY)
Admission: EM | Admit: 2022-09-25 | Discharge: 2022-09-25 | Disposition: A | Discharge status: Home / Self Care | Payer: Self-pay | Attending: Student | Admitting: Student

## 2022-09-25 ENCOUNTER — Other Ambulatory Visit: Payer: Self-pay

## 2022-09-25 ENCOUNTER — Encounter (HOSPITAL_COMMUNITY): Payer: Self-pay

## 2022-09-25 DIAGNOSIS — T7840XA Allergy, unspecified, initial encounter: Secondary | ICD-10-CM

## 2022-09-25 DIAGNOSIS — L500 Allergic urticaria: Secondary | ICD-10-CM | POA: Insufficient documentation

## 2022-09-25 MED ORDER — EPINEPHRINE 0.3 MG/0.3ML IJ SOAJ
0.3000 mg | Freq: Once | INTRAMUSCULAR | Status: AC
  Administered 2022-09-25: 0.3 mg via INTRAMUSCULAR

## 2022-09-25 NOTE — ED Provider Notes (Signed)
[facility] EMERGENCY DEPARTMENT Provider Note  CSN: [id number] Arrival date & time: 09/25/22 1347  Chief Complaint(s) Allergic Reaction  HPI Tamara Hanna is a 51 y.o. female with PMH anxiety, depression, migraines who presents emergency department for evaluation of an allergic reaction.  Patient was being seen at her primary care office for sore throat and received a intramuscular injection of ceftriaxone and Decadron.  She then started to develop hives and erythema.  EMS gave 50 mg of Benadryl prior to arrival.  She does endorse some mild dysphagia and sore throat but is unclear as to whether or not this is worse after the injection or secondary to her upper respiratory infection.  Denies current shortness of breath, vomiting, abdominal pain or other systemic symptoms.   Past Medical History Past Medical History:  Diagnosis Date   Anxiety    Depression    Migraines    There are no problems to display for this patient.  Home Medication(s) Prior to Admission medications   Medication Sig Start Date End Date Taking? Authorizing Provider  Apremilast (OTEZLA) 30 MG TABS Take by mouth.    [provider]  B Complex Vitamins (B COMPLEX PO) Take by mouth.    [provider]  Cholecalciferol (VITAMIN D3 PO) Take by mouth.    [provider]  MAGNESIUM PO Take by mouth.    [provider]  Turmeric (QC TUMERIC COMPLEX PO) Take by mouth.    [provider]                                                                                                                                    Past Surgical History Past Surgical History:  Procedure Laterality Date   CESAREAN SECTION     Family History Family History  Problem Relation Age of Onset   Heart disease Mother        pace maker   Heart disease Maternal Grandmother    Hypertension Maternal Grandmother     Social History Social History   Tobacco Use    Smoking status: Never   Smokeless tobacco: Never  Substance Use Topics   Alcohol use: Not Currently   Drug use: Never   Allergies Patient has no known allergies.  Review of Systems Review of Systems  Skin:  Positive for rash.    Physical Exam Vital Signs  I have reviewed the triage vital signs BP (!) 99/56 (BP Location: Right Arm)   Pulse 64   Temp 98 F (36.7 C) (Oral)   Resp 20   SpO2 100%   Physical Exam Vitals and nursing note reviewed.  Constitutional:      General: She is not in acute distress.    Appearance: She is well-developed.  HENT:     Head: Normocephalic and atraumatic.  Eyes:     Conjunctiva/sclera: Conjunctivae normal.  Cardiovascular:     Rate and  Rhythm: Normal rate and regular rhythm.     Heart sounds: No murmur heard. Pulmonary:     Effort: Pulmonary effort is normal. No respiratory distress.     Breath sounds: Normal breath sounds.  Abdominal:     Palpations: Abdomen is soft.     Tenderness: There is no abdominal tenderness.  Musculoskeletal:        General: No swelling.     Cervical back: Neck supple.  Skin:    General: Skin is warm and dry.     Capillary Refill: Capillary refill takes less than 2 seconds.     Findings: Erythema and rash present.  Neurological:     Mental Status: She is alert.  Psychiatric:        Mood and Affect: Mood normal.     ED Results and Treatments Labs (all labs ordered are listed, but only abnormal results are displayed) Labs Reviewed - No data to display                                                                                                                        Radiology No results found.  Pertinent labs & imaging results that were available during my care of the patient were reviewed by me and considered in my medical decision making (see MDM for details).  Medications Ordered in ED Medications  EPINEPHrine (EPI-PEN) injection 0.3 mg (has no administration in time range)                                                                                                                                      Procedures .Critical Care  Performed by: Glendora Score, MD Authorized by: Glendora Score, MD   Critical care provider statement:    Critical care time (minutes):  30   Critical care was necessary to treat or prevent imminent or life-threatening deterioration of the following conditions: anaphyalxis.   Critical care was time spent personally by me on the following activities:  Development of treatment plan with patient or surrogate, discussions with consultants, evaluation of patient's response to treatment, examination of patient, ordering and review of laboratory studies, ordering and review of radiographic studies, ordering and performing treatments and interventions, pulse oximetry, re-evaluation of patient's condition and review of old charts   (including critical care time)  Medical Decision Making / ED Course   This patient presents to the ED for  concern of allergic reaction, this involves an extensive number of treatment options, and is a complaint that carries with it a high risk of complications and morbidity.  The differential diagnosis includes anaphylaxis, allergic reaction, fixed drug eruption, urticaria  MDM: Seen in the emergency room for evaluation of suspected allergic reaction.  Physical exam with erythema over the trunk, abdomen and bilateral upper and lower extremities.  No wheezing on lung exam and no obvious oropharyngeal swelling but due to patient's complaints of dysphagia and rash, she now has 2 system involvement and an epinephrine pen was administered at 1400.  Patient also arriving mildly hypotensive with softer blood pressures at 99/56 on arrival further backing up the use of epinephrine in this scenario.  She is already received steroids and Benadryl prior to arrival and additional medication not required at this time.  She will require hospital  observation for 4 hours prior to discharge.  Please see provider signout for continuation of work-up.   Additional history obtained:  -External records from outside source obtained and reviewed including: Chart review including previous notes, labs, imaging, consultation notes    Medicines ordered and prescription drug management: Meds ordered this encounter  Medications   EPINEPHrine (EPI-PEN) injection 0.3 mg    -I have reviewed the patients home medicines and have made adjustments as needed  Critical interventions Epinephrine administration   Cardiac Monitoring: The patient was maintained on a cardiac monitor.  I personally viewed and interpreted the cardiac monitored which showed an underlying rhythm of: NSR  Social Determinants of Health:  Factors impacting patients care include: Spanish-speaking   Reevaluation: After the interventions noted above, I reevaluated the patient and found that they have :improved  Co morbidities that complicate the patient evaluation  Past Medical History:  Diagnosis Date   Anxiety    Depression    Migraines       Dispostion: I considered admission for this patient, and disposition will be pending ER observation after epinephrine administration.  Please see provider signout for continuation of work-up     Final Clinical Impression(s) / ED Diagnoses Final diagnoses:  None     @PCDICTATION @    Analisse Randle, Debe Coder, MD 09/25/22 1430

## 2022-09-25 NOTE — Discharge Instructions (Signed)
It was our pleasure to provide your ER care today - we hope that you feel better.  Drink plenty of fluids/stay well hydrated.   Take benadryl 25 mg every 6 hours for the remainder of day today.   Follow up closely with primary care doctor in the coming week.  Return to ER if worse, new symptoms, fevers, recurrence of earlier symptoms, trouble breathing, or other concern.

## 2022-09-25 NOTE — ED Triage Notes (Addendum)
Pt bib [facility] from [facility] where she was being seen for sore throat. Pt received Ceftriaxone and Dexamethasone IM and has full body hives and redness now. EMS gave pt 50 mg benadryl en route. Pt arrives AOx4 denies pain.  Airway intact and no swelling noted

## 2022-09-25 NOTE — ED Provider Notes (Signed)
Signed out that received meds for suspected allergic rxn, and that symptoms improved, but to watch until about 6 pm and then d/c.   Pt indicates symptoms resolved. No rash or itching. No throat pain or swelling. No sob. Chest cta. Rash resolved. Vitals normal.   Pt currently appears stable for d/c.   Return precautions provided.      Lajean Saver, MD 09/25/22 [phone]

## 2024-05-29 ENCOUNTER — Other Ambulatory Visit: Payer: Self-pay | Admitting: Pain Medicine

## 2024-05-29 DIAGNOSIS — R109 Unspecified abdominal pain: Secondary | ICD-10-CM

## 2024-06-01 ENCOUNTER — Ambulatory Visit
Admission: RE | Admit: 2024-06-01 | Discharge: 2024-06-01 | Disposition: A | Discharge status: Home / Self Care | Payer: Self-pay | Source: Ambulatory Visit | Attending: Pain Medicine | Admitting: Pain Medicine

## 2024-06-01 DIAGNOSIS — R109 Unspecified abdominal pain: Secondary | ICD-10-CM
